# Patient Record
Sex: Male | Born: 1980 | Race: White | Hispanic: No | Marital: Married | Smoking: Current every day smoker
Health system: Southern US, Community
[De-identification: ages and names within clinical notes are randomized; demographics above are authoritative.]

---

## 2016-07-25 ENCOUNTER — Emergency Department
Admission: EM | Admit: 2016-07-25 | Discharge: 2016-07-25 | Disposition: A | Discharge status: Home / Self Care | Payer: Self-pay | Attending: Emergency Medicine | Admitting: Emergency Medicine

## 2016-07-25 ENCOUNTER — Encounter: Payer: Self-pay | Admitting: Emergency Medicine

## 2016-07-25 DIAGNOSIS — F1410 Cocaine abuse, uncomplicated: Secondary | ICD-10-CM | POA: Insufficient documentation

## 2016-07-25 DIAGNOSIS — F1310 Sedative, hypnotic or anxiolytic abuse, uncomplicated: Secondary | ICD-10-CM | POA: Insufficient documentation

## 2016-07-25 DIAGNOSIS — F17210 Nicotine dependence, cigarettes, uncomplicated: Secondary | ICD-10-CM | POA: Insufficient documentation

## 2016-07-25 DIAGNOSIS — F1520 Other stimulant dependence, uncomplicated: Secondary | ICD-10-CM | POA: Insufficient documentation

## 2016-07-25 DIAGNOSIS — F1910 Other psychoactive substance abuse, uncomplicated: Secondary | ICD-10-CM

## 2016-07-25 LAB — CBC
HEMATOCRIT: 42.1 % (ref 40.0–52.0)
HEMOGLOBIN: 14.3 g/dL (ref 13.0–18.0)
MCH: 29 pg (ref 26.0–34.0)
MCHC: 34 g/dL (ref 32.0–36.0)
MCV: 85.3 fL (ref 80.0–100.0)
Platelets: 247 10*3/uL (ref 150–440)
RBC: 4.93 MIL/uL (ref 4.40–5.90)
RDW: 13.9 % (ref 11.5–14.5)
WBC: 12.9 10*3/uL — ABNORMAL HIGH (ref 3.8–10.6)

## 2016-07-25 LAB — BASIC METABOLIC PANEL
Anion gap: 6 (ref 5–15)
BUN: 8 mg/dL (ref 6–20)
CHLORIDE: 105 mmol/L (ref 101–111)
CO2: 29 mmol/L (ref 22–32)
Calcium: 9.1 mg/dL (ref 8.9–10.3)
Creatinine, Ser: 0.8 mg/dL (ref 0.61–1.24)
GFR calc non Af Amer: >60 mL/min (ref >60)
Glucose, Bld: 96 mg/dL (ref 65–99)
POTASSIUM: 3.5 mmol/L (ref 3.5–5.1)
Sodium: 140 mmol/L (ref 135–145)

## 2016-07-25 LAB — URINE DRUG SCREEN, QUALITATIVE (ARMC ONLY)
Amphetamines, Ur Screen: NOT DETECTED
Barbiturates, Ur Screen: NOT DETECTED
Benzodiazepine, Ur Scrn: POSITIVE — AB
COCAINE METABOLITE, UR ~~LOC~~: POSITIVE — AB
Cannabinoid 50 Ng, Ur ~~LOC~~: NOT DETECTED
MDMA (ECSTASY) UR SCREEN: NOT DETECTED
METHADONE SCREEN, URINE: NOT DETECTED
OPIATE, UR SCREEN: NOT DETECTED
Phencyclidine (PCP) Ur S: NOT DETECTED
TRICYCLIC, UR SCREEN: NOT DETECTED

## 2016-07-25 LAB — ETHANOL: Alcohol, Ethyl (B): <5 mg/dL (ref <5)

## 2016-07-25 NOTE — ED Provider Notes (Signed)
[facility] Emergency Department Provider Note  ____________________________________________  Time seen: Approximately 8:02 PM  I have reviewed the triage vital signs and the nursing notes.   HISTORY  Chief Complaint Drug Problem    HPI Joshua Ball is a 36 y.o. male w/ a hx of drug abuse presentingfor detox. The patient reports that he abuses "everything." He admits to methamphetamines, cocaine. He has no medical complaints at this time.   History reviewed. No pertinent past medical history.  There are no active problems to display for this patient.   History reviewed. No pertinent surgical history.    Allergies Patient has no known allergies.  History reviewed. No pertinent family history.  Social History Social History  Substance Use Topics  . Smoking status: Current Every Day Smoker  . Smokeless tobacco: Never Used  . Alcohol use Yes    Review of Systems Constitutional: No fever/chills.No shakiness. No general malaise. Eyes: No visual changes. ENT: No sore throat. No congestion or rhinorrhea. Cardiovascular: Denies chest pain. Denies palpitations. Respiratory: Denies shortness of breath.  No cough. Gastrointestinal: No abdominal pain.  No nausea, no vomiting.  No diarrhea.  No constipation. Genitourinary: Negative for dysuria. Musculoskeletal: Negative for back pain. Skin: Negative for rash. Neurological: Negative for headaches. No focal numbness, tingling or weakness.  Psychiatric:Denies SI, HI or hallucinations. Positive polysubstance abuse.  ____________________________________________   PHYSICAL EXAM:  VITAL SIGNS: ED Triage Vitals [07/25/16 1926]  Enc Vitals Group     BP 119/85     Pulse Rate (!) 110     Resp 16     Temp 98 F (36.7 C)     Temp Source Oral     SpO2 99 %     Weight 150 lb (68 kg)     Height 5\' 7"  (1.702 m)     Head Circumference      Peak Flow      Pain Score      Pain Loc      Pain Edu?       Excl. in [place]?     Constitutional: Alert and oriented. Well appearing and in no acute distress. Answers questions appropriately. Eyes: Conjunctivae are normal.  EOMI. No scleral icterus. Head: Atraumatic. Nose: No congestion/rhinnorhea. Mouth/Throat: Mucous membranes are moist.  Neck: No stridor.  Supple.   Cardiovascular: Normal rate, regular rhythm. No murmurs, rubs or gallops.  Respiratory: Normal respiratory effort.  No accessory muscle use or retractions. Lungs CTAB.  No wheezes, rales or ronchi. Gastrointestinal: Soft, nontender and nondistended.  No guarding or rebound.  No peritoneal signs. Musculoskeletal: No LE edema.  Neurologic:  A&Ox3.  Speech is clear.  Face and smile are symmetric.  EOMI.  Moves all extremities well. Skin:  Skin is warm, dry and intact. No rash noted. Psychiatric: + Polysubstance abuse.  Denies SI, HI or hallucinations.  ____________________________________________   LABS (all labs ordered are listed, but only abnormal results are displayed)  Labs Reviewed  CBC  BASIC METABOLIC PANEL  URINE DRUG SCREEN, QUALITATIVE ([facility] ONLY)  ETHANOL   ____________________________________________  EKG  Not indicated ____________________________________________  RADIOLOGY  No results found.  ____________________________________________   PROCEDURES  Procedure(s) performed: None  Procedures  Critical Care performed: No ____________________________________________   INITIAL IMPRESSION / ASSESSMENT AND PLAN / ED COURSE  Pertinent labs & imaging results that were available during my care of the patient were reviewed by me and considered in my medical decision making (see chart for details).  [age] y.o.  M w/ hx of polysubstance abuse presenting for detox.  Pt denies any SI, HI, or hallucinations and has no medical complaints.  D/W TTS who will come evaluate pt for outpatient detox.  ____________________________________________  FINAL CLINICAL  IMPRESSION(S) / ED DIAGNOSES  Final diagnoses:  Polysubstance abuse         NEW MEDICATIONS STARTED DURING THIS VISIT:  New Prescriptions   No medications on file      Rockne Menghini, MD 07/25/16 2010

## 2016-07-25 NOTE — ED Notes (Signed)
Pt presents to ED, requesting detox from cocaine, meth, ativan and cymboxone. Pt sates he has been using for about 6 months. Pt alerts and oriented x3 at this time.

## 2016-07-25 NOTE — Discharge Instructions (Signed)
Please return to the emergency department for severe pain, or any other symptoms concerning to you.

## 2016-07-25 NOTE — ED Triage Notes (Signed)
Pt ambulatory to triage with steady gait, no distress noted. Pt reports he has been using Meth, Cocaine, ativan and cymboxone for approximately 6 months and wants to go through detox to come off of drugs. Pt sts he has used cocaine, ativan and cymboxone today, denies meth and alcohol.

## 2016-07-25 NOTE — ED Notes (Signed)
Pt refused to wait to go detox facility at midnight. Pt denies SI/HI at this time.

## 2018-12-31 ENCOUNTER — Encounter: Payer: Self-pay | Admitting: Emergency Medicine

## 2018-12-31 ENCOUNTER — Other Ambulatory Visit: Payer: Self-pay

## 2018-12-31 DIAGNOSIS — F17210 Nicotine dependence, cigarettes, uncomplicated: Secondary | ICD-10-CM | POA: Insufficient documentation

## 2018-12-31 DIAGNOSIS — J0191 Acute recurrent sinusitis, unspecified: Secondary | ICD-10-CM | POA: Insufficient documentation

## 2018-12-31 NOTE — ED Triage Notes (Signed)
Pt c/o left ear pain x 2 days, reports trying tea tree oil and peroxide no relief.  Pt reports having clogged sinus cavity several years ago on the same side and needed oral anitbiotics.

## 2019-01-01 ENCOUNTER — Emergency Department
Admission: EM | Admit: 2019-01-01 | Discharge: 2019-01-01 | Disposition: A | Discharge status: Home / Self Care | Payer: Self-pay | Attending: Emergency Medicine | Admitting: Emergency Medicine

## 2019-01-01 DIAGNOSIS — J0191 Acute recurrent sinusitis, unspecified: Secondary | ICD-10-CM

## 2019-01-01 MED ORDER — IBUPROFEN 600 MG PO TABS
600.0000 mg | ORAL_TABLET | Freq: Once | ORAL | Status: AC
  Administered 2019-01-01: 02:00:00 600 mg via ORAL
  Filled 2019-01-01: qty 1

## 2019-01-01 MED ORDER — OXYMETAZOLINE HCL 0.05 % NA SOLN
1.0000 | Freq: Once | NASAL | Status: AC
  Administered 2019-01-01: 02:00:00 1 via NASAL
  Filled 2019-01-01: qty 30

## 2019-01-01 MED ORDER — FLUTICASONE FUROATE 27.5 MCG/SPRAY NA SUSP: 1.0000 | Freq: Every day | NASAL | 0 refills | Status: AC

## 2019-01-01 NOTE — ED Provider Notes (Signed)
[facility] Emergency Department Provider Note  ____________________________________________  Time seen: Approximately 1:46 AM  I have reviewed the triage vital signs and the nursing notes.   HISTORY  Chief Complaint Otalgia   HPI Bart Ashford is a 38 y.o. male no significant past medical history who presents for evaluation of left ear pain.  Patient reports 2 days of pressure-like left ear pain associated with left frontal sinus pressure, pain on the left upper jaw area, and congestion.  No diplopia, no fever, no sore throat, no changes in hearing, no disequilibrium.  Has tried tea tree oil and peroxide inside his ear with no relief.   PMH None -reviewed  Prior to Admission medications   Medication Sig Start Date End Date Taking? Authorizing Provider  fluticasone (VERAMYST) 27.5 MCG/SPRAY nasal spray Place 1 spray into the nose daily. 01/01/19 01/01/20  Rudene Re, MD    Allergies Patient has no known allergies.  No family history on file.  Social History Social History   Tobacco Use  . Smoking status: Current Every Day Smoker    Packs/day: 1.00    Types: Cigarettes  . Smokeless tobacco: Never Used  Substance Use Topics  . Alcohol use: Yes  . Drug use: Yes    Types: Cocaine, Methamphetamines    Review of Systems  Constitutional: Negative for fever. Eyes: Negative for visual changes. ENT: Negative for sore throat. + L ear pain, L frontal sinus pressure Neck: No neck pain  Cardiovascular: Negative for chest pain. Respiratory: Negative for shortness of breath. Gastrointestinal: Negative for abdominal pain, vomiting or diarrhea. Genitourinary: Negative for dysuria. Musculoskeletal: Negative for back pain. Skin: Negative for rash. Neurological: Negative for headaches, weakness or numbness. Psych: No SI or HI  ____________________________________________   PHYSICAL EXAM:  VITAL SIGNS: ED Triage Vitals  Enc Vitals  Group     BP 12/31/18 2322 123/85     Pulse Rate 12/31/18 2322 92     Resp 12/31/18 2322 18     Temp 12/31/18 2322 99.6 F (37.6 C)     Temp Source 12/31/18 2322 Oral     SpO2 12/31/18 2322 98 %     Weight --      Height --      Head Circumference --      Peak Flow --      Pain Score 12/31/18 2324 8     Pain Loc --      Pain Edu? --      Excl. in [place]? --     Constitutional: Alert and oriented. Well appearing and in no apparent distress. HEENT:      Head: Normocephalic and atraumatic.   Palpation of the left frontal sinus induces mild discomfort      Eyes: Conjunctivae are normal. Sclera is non-icteric.  Extraocular movements are intact, pupils are equal and round and reactive, no swelling or signs of periorbital cellulitis.      Ears: Normal outer canal with no evidence of infection, normal TM with no exudate, landmarks are clear, no tenderness of the mastoid process, no erythema or tenderness of the outer ear      Mouth/Throat: Mucous membranes are moist.  Oropharynx is clear with no exudates, no peritonsillar abscess, no trismus, floor of the mouth is soft      Neck: Supple with no signs of meningismus. Cardiovascular: Regular rate and rhythm.  Respiratory: Normal respiratory effort.  Musculoskeletal: No edema, cyanosis, or erythema of extremities. Neurologic: Normal speech and language.  Face is symmetric. Moving all extremities. No gross focal neurologic deficits are appreciated. Skin: Skin is warm, dry and intact. No rash noted. Psychiatric: Mood and affect are normal. Speech and behavior are normal.  ____________________________________________   LABS (all labs ordered are listed, but only abnormal results are displayed)  Labs Reviewed - No data to display ____________________________________________  EKG  none  ____________________________________________  RADIOLOGY  none  ____________________________________________   PROCEDURES  Procedure(s) performed:  None Procedures Critical Care performed:  None ____________________________________________   INITIAL IMPRESSION / ASSESSMENT AND PLAN / ED COURSE   38 y.o. male no significant past medical history who presents for evaluation of left ear pain.  Presentation concerning with sinus pain and pressure.  Recommended sinus washout, patient was provided with Afrin and nasal fluticasone, ibuprofen for pain, follow-up with ENT as needed.  Discussed my standard return precautions.       As part of my medical decision making, I reviewed the following data within the electronic MEDICAL RECORD NUMBER Nursing notes reviewed and incorporated, Old chart reviewed, Notes from prior ED visits and [place] Controlled Substance Database   Please note:  Patient was evaluated in Emergency Department today for the symptoms described in the history of present illness. Patient was evaluated in the context of the global COVID-19 pandemic, which necessitated consideration that the patient might be at risk for infection with the SARS-CoV-2 virus that causes COVID-19. Institutional protocols and algorithms that pertain to the evaluation of patients at risk for COVID-19 are in a state of rapid change based on information released by regulatory bodies including the CDC and federal and state organizations. These policies and algorithms were followed during the patient's care in the ED.  Some ED evaluations and interventions may be delayed as a result of limited staffing during the pandemic.   ____________________________________________   FINAL CLINICAL IMPRESSION(S) / ED DIAGNOSES   Final diagnoses:  Acute recurrent sinusitis, unspecified location      NEW MEDICATIONS STARTED DURING THIS VISIT:  ED Discharge Orders         Ordered    fluticasone (VERAMYST) 27.5 MCG/SPRAY nasal spray  Daily     01/01/19 0136           Note:  This document was prepared using Dragon voice recognition software and may include  unintentional dictation errors.    Don Perking, [place], MD 01/01/19 [phone]

## 2019-01-01 NOTE — ED Notes (Signed)
Pt sitting up in bed; in NAD. C/o HA, pain in all of his teeth, and L ear pain/pressure since yesterday.  Denies sore throat, fevers, or any other symptoms. Reports normal hearing out of L ear. States no reliefs so far with OTC meds at home.

## 2019-10-03 ENCOUNTER — Emergency Department: Payer: Self-pay

## 2019-10-03 ENCOUNTER — Other Ambulatory Visit: Payer: Self-pay

## 2019-10-03 ENCOUNTER — Emergency Department
Admission: EM | Admit: 2019-10-03 | Discharge: 2019-10-03 | Disposition: A | Discharge status: Home / Self Care | Payer: Self-pay | Attending: Emergency Medicine | Admitting: Emergency Medicine

## 2019-10-03 ENCOUNTER — Encounter: Payer: Self-pay | Admitting: Emergency Medicine

## 2019-10-03 DIAGNOSIS — Z20822 Contact with and (suspected) exposure to covid-19: Secondary | ICD-10-CM | POA: Insufficient documentation

## 2019-10-03 DIAGNOSIS — R531 Weakness: Secondary | ICD-10-CM

## 2019-10-03 DIAGNOSIS — R0602 Shortness of breath: Secondary | ICD-10-CM | POA: Insufficient documentation

## 2019-10-03 DIAGNOSIS — U071 COVID-19: Secondary | ICD-10-CM

## 2019-10-03 DIAGNOSIS — R05 Cough: Secondary | ICD-10-CM | POA: Insufficient documentation

## 2019-10-03 DIAGNOSIS — R079 Chest pain, unspecified: Secondary | ICD-10-CM | POA: Insufficient documentation

## 2019-10-03 DIAGNOSIS — R5383 Other fatigue: Secondary | ICD-10-CM | POA: Insufficient documentation

## 2019-10-03 DIAGNOSIS — R0789 Other chest pain: Secondary | ICD-10-CM

## 2019-10-03 DIAGNOSIS — R059 Cough, unspecified: Secondary | ICD-10-CM

## 2019-10-03 DIAGNOSIS — F17210 Nicotine dependence, cigarettes, uncomplicated: Secondary | ICD-10-CM | POA: Insufficient documentation

## 2019-10-03 DIAGNOSIS — M6281 Muscle weakness (generalized): Secondary | ICD-10-CM | POA: Insufficient documentation

## 2019-10-03 LAB — SARS CORONAVIRUS 2 BY RT PCR (HOSPITAL ORDER, PERFORMED IN ~~LOC~~ HOSPITAL LAB): SARS Coronavirus 2: NEGATIVE

## 2019-10-03 LAB — CBC
HCT: 43.3 % (ref 39.0–52.0)
Hemoglobin: 14.6 g/dL (ref 13.0–17.0)
MCH: 29.5 pg (ref 26.0–34.0)
MCHC: 33.7 g/dL (ref 30.0–36.0)
MCV: 87.5 fL (ref 80.0–100.0)
Platelets: 255 10*3/uL (ref 150–400)
RBC: 4.95 MIL/uL (ref 4.22–5.81)
RDW: 13.2 % (ref 11.5–15.5)
WBC: 10.8 10*3/uL — ABNORMAL HIGH (ref 4.0–10.5)
nRBC: 0 % (ref 0.0–0.2)

## 2019-10-03 LAB — BASIC METABOLIC PANEL
Anion gap: 10 (ref 5–15)
BUN: 11 mg/dL (ref 6–20)
CO2: 24 mmol/L (ref 22–32)
Calcium: 9.3 mg/dL (ref 8.9–10.3)
Chloride: 104 mmol/L (ref 98–111)
Creatinine, Ser: 0.82 mg/dL (ref 0.61–1.24)
GFR calc Af Amer: >60 mL/min (ref >60)
GFR calc non Af Amer: >60 mL/min (ref >60)
Glucose, Bld: 91 mg/dL (ref 70–99)
Potassium: 3.9 mmol/L (ref 3.5–5.1)
Sodium: 138 mmol/L (ref 135–145)

## 2019-10-03 LAB — TROPONIN I (HIGH SENSITIVITY): Troponin I (High Sensitivity): 4 ng/L (ref <18)

## 2019-10-03 NOTE — ED Triage Notes (Signed)
Pt reports for the past week has had a productive cough, chest heaviness and sob. Pt states has gotten worse over the last couple of days.

## 2019-10-03 NOTE — ED Provider Notes (Signed)
[facility] Emergency Department Provider Note ____________________________________________   First MD Initiated Contact with Patient 10/03/19 1833     (approximate)  I have reviewed the triage vital signs and the nursing notes.  HISTORY  Chief Complaint Cough, Chest Pain, and Shortness of Breath   HPI [name] is a 39 y.o. malewho presents to the ED for evaluation of shortness of breath, cough and lethargy.  Chart review indicates no relevant medical history. Patient reports taking no regular prescription medications Patient is not vaccinated for COVID-19.  No one is sick at home.  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   Patient reports about 1 week of cough that is occasionally productive of sputum, constant chest heaviness, shortness of breath and lethargy.  He reports having a consistent heaviness sensation over the past 1 week over his right chest that is nonradiating, and has no exertional worsening.  Reports shortness of breath, cough that is typically nonproductive, but he reports is occasionally productive of yellow sputum.  Reports generalized weakness, lethargy and altered taste/smell.  Further reports poor p.o. intake of solids due to lack of appetite.  Denies postprandial abdominal pain.  He denies any syncopal episodes, but reports having to take multiple days off of work due to feeling so weak and tired that he was unable to function at work.  Denies documented fevers, though reports subjective chills and was noted to be 100 F in triage.  History reviewed. No pertinent past medical history.  There are no problems to display for this patient.   History reviewed. No pertinent surgical history.  Prior to Admission medications   Medication Sig Start Date End Date Taking? Authorizing Provider  fluticasone (VERAMYST) 27.5 MCG/SPRAY nasal spray Place 1 spray into the nose daily. 01/01/19 01/01/20  Nita Sickle, MD    Allergies Patient has no known allergies.  No family history on file.  Social History Social History   Tobacco Use  . Smoking status: Current Every Day Smoker    Packs/day: 1.00    Types: Cigarettes  . Smokeless tobacco: Never Used  Vaping Use  . Vaping Use: Never used  Substance Use Topics  . Alcohol use: Yes  . Drug use: Yes    Types: Cocaine, Methamphetamines    Review of Systems  Constitutional: Positive for subjective fever/chills Eyes: No visual changes. ENT: No sore throat. Cardiovascular: Positive for chest pain Respiratory: Positive for cough and shortness of breath Gastrointestinal: No abdominal pain.  No nausea, no vomiting.  No diarrhea.  No constipation. Genitourinary: Negative for dysuria. Musculoskeletal: Negative for back pain. Skin: Negative for rash. Neurological: Negative for headaches, focal weakness or numbness. ____________________________________________   PHYSICAL EXAM:  VITAL SIGNS: Vitals:   10/03/19 1808 10/03/19 1825  BP: 119/84 137/84  Pulse: 81 80  Resp: 18 16  Temp: 98.3 F (36.8 C) 98.1 F (36.7 C)  SpO2: 98% 98%     Constitutional: Alert and oriented. Well appearing and in no acute distress.  Conversational in full sentences without distress. Eyes: Conjunctivae are normal. PERRL. EOMI. Head: Atraumatic. Nose: No congestion/rhinnorhea. Mouth/Throat: Mucous membranes are moist.  Oropharynx non-erythematous.  Uvula is midline Neck: No stridor. No cervical spine tenderness to palpation. Cardiovascular: Normal rate, regular rhythm. Grossly normal heart sounds.  Good peripheral circulation. Respiratory: Normal respiratory effort.  No retractions. Lungs CTAB. Gastrointestinal: Soft , nondistended, nontender to palpation. No abdominal bruits. No CVA tenderness. Musculoskeletal: No lower extremity tenderness nor edema.  No joint effusions. No signs of acute trauma. Neurologic:  Normal speech and language. No gross  focal neurologic deficits are appreciated. No gait instability noted. Skin:  Skin is warm, dry and intact. No rash noted. Psychiatric: Mood and affect are normal. Speech and behavior are normal.  ____________________________________________   LABS (all labs ordered are listed, but only abnormal results are displayed)  Labs Reviewed  CBC - Abnormal; Notable for the following components:      Result Value   WBC 10.8 (*)    All other components within normal limits  SARS CORONAVIRUS 2 BY RT PCR (HOSPITAL ORDER, PERFORMED IN [facility])  BASIC METABOLIC PANEL  TROPONIN I (HIGH SENSITIVITY)   ____________________________________________  12 Lead EKG  Sinus rhythm, rate of 90 bpm.  Normal axis and intervals.  No evidence of acute ischemia. ____________________________________________  RADIOLOGY  ED MD interpretation: 2 view CXR without evidence of acute cardiopulmonary pathology.  Official radiology report(s): DG Chest 2 View  Result Date: 10/03/2019 CLINICAL DATA:  Chest pain EXAM: CHEST - 2 VIEW COMPARISON:  None. FINDINGS: The heart size and mediastinal contours are within normal limits. Both lungs are clear. The visualized skeletal structures are unremarkable. IMPRESSION: No active cardiopulmonary disease. Electronically Signed   By: Jonna Clark M.D.   On: 10/03/2019 17:35    ____________________________________________   PROCEDURES and INTERVENTIONS  Procedure(s) performed (including Critical Care):  Procedures  Medications - No data to display  ____________________________________________   MDM / ED COURSE  39 year old unvaccinated male presents with viral syndrome most consistent with COVID-19 despite him testing negative, amenable to outpatient management.  Nearly febrile in triage with temperature of 100 F, otherwise normal vital signs on room air.  Exam is reassuring without evidence of acute pathology derangements.  He has no dyspnea, tachypnea,  distress, signs of trauma or any other acute pathology.  Blood work demonstrates no acute derangements.  CXR is clear.  EKG is nonischemic and troponin is negative.  His generalized symptoms are most consistent with a viral syndrome, and most consistent with COVID-19.  PCR swab obtained during his ED visit returns negative right before he leaves.  I advised him of this negative test result, but recommend he quarantine at home and mask around others as if he tested positive.  I have a high suspicion for COVID-19 despite his negative test.  We discussed outpatient management of his viral illness. ,  We discussed return precautions for the ED.  Patient medically stable for discharge home.      ____________________________________________   FINAL CLINICAL IMPRESSION(S) / ED DIAGNOSES  Final diagnoses:  Clinical diagnosis of COVID-19  Generalized weakness  Cough  Other chest pain     ED Discharge Orders    None       Joshua Ball   Note:  This document was prepared using Dragon voice recognition software and may include unintentional dictation errors.   Delton [place], MD 10/03/19 2051

## 2019-10-03 NOTE — Discharge Instructions (Signed)
Please take Tylenol and ibuprofen/Advil for your pain.  It is safe to take them together, or to alternate them every few hours.  Take up to 1000mg  of Tylenol at a time, up to 4 times per day.  Do not take more than 4000 mg of Tylenol in 24 hours.  For ibuprofen, take 400-600 mg, 4-5 times per day.  As we discussed, I strongly suspect that you have COVID-19. Your swab is in process and I will give you a call after results.  Regardless of this test result, based off of all of your symptoms, I would still stay home quarantine and mask until you feel better and treat this like a COVID-19.  Use the above Tylenol/ibuprofen regimen to help control your fevers and muscle aches, helping you feel better to stay hydrated and get some rest.  It is okay if you do not eat solid food for 2 days.  If you develop any worsening symptoms despite these medications, please return to the ED.

## 2021-12-17 IMAGING — CR DG CHEST 2V
2 series · 2 of 2 positions shown · non-contrast
Comparison: None.

CLINICAL DATA: Chest pain

EXAM:
CHEST - 2 VIEW

[chest pa]
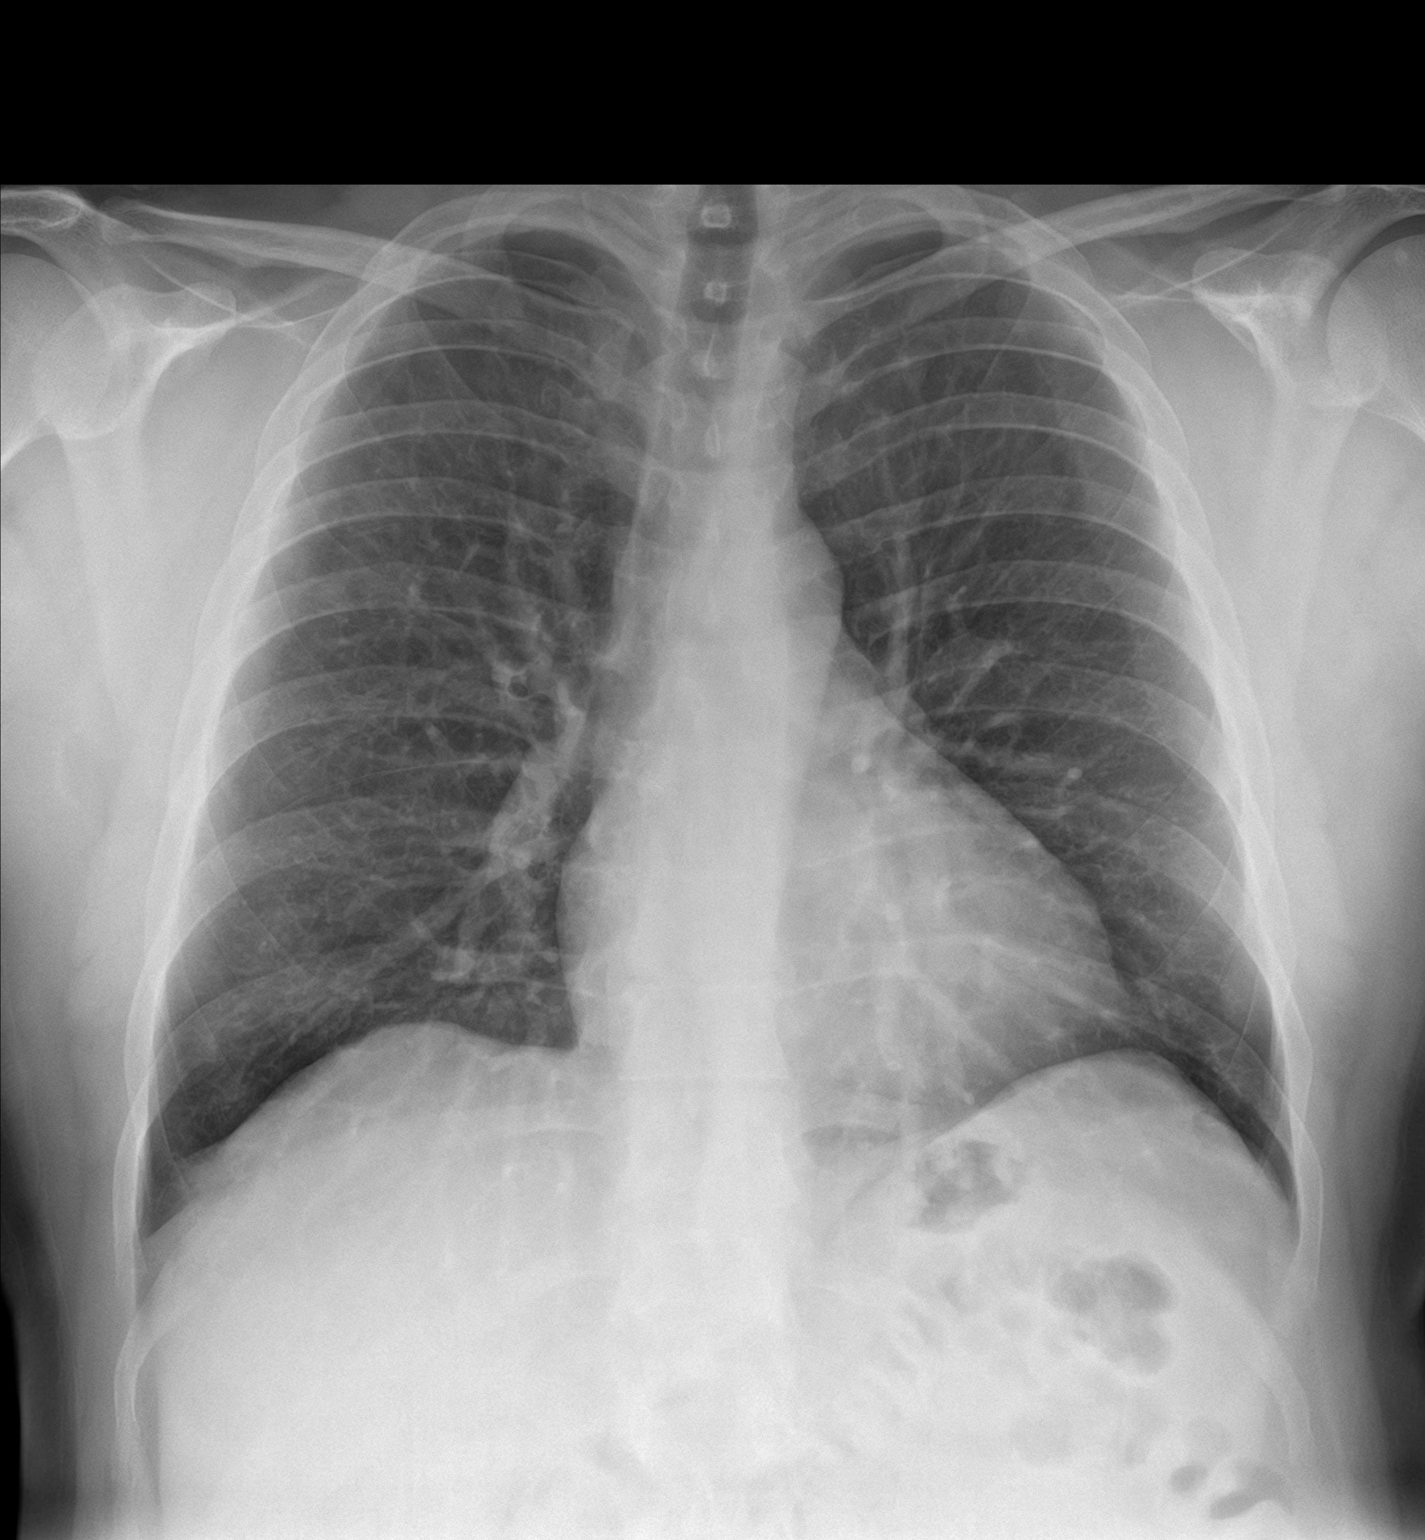

[chest lat]
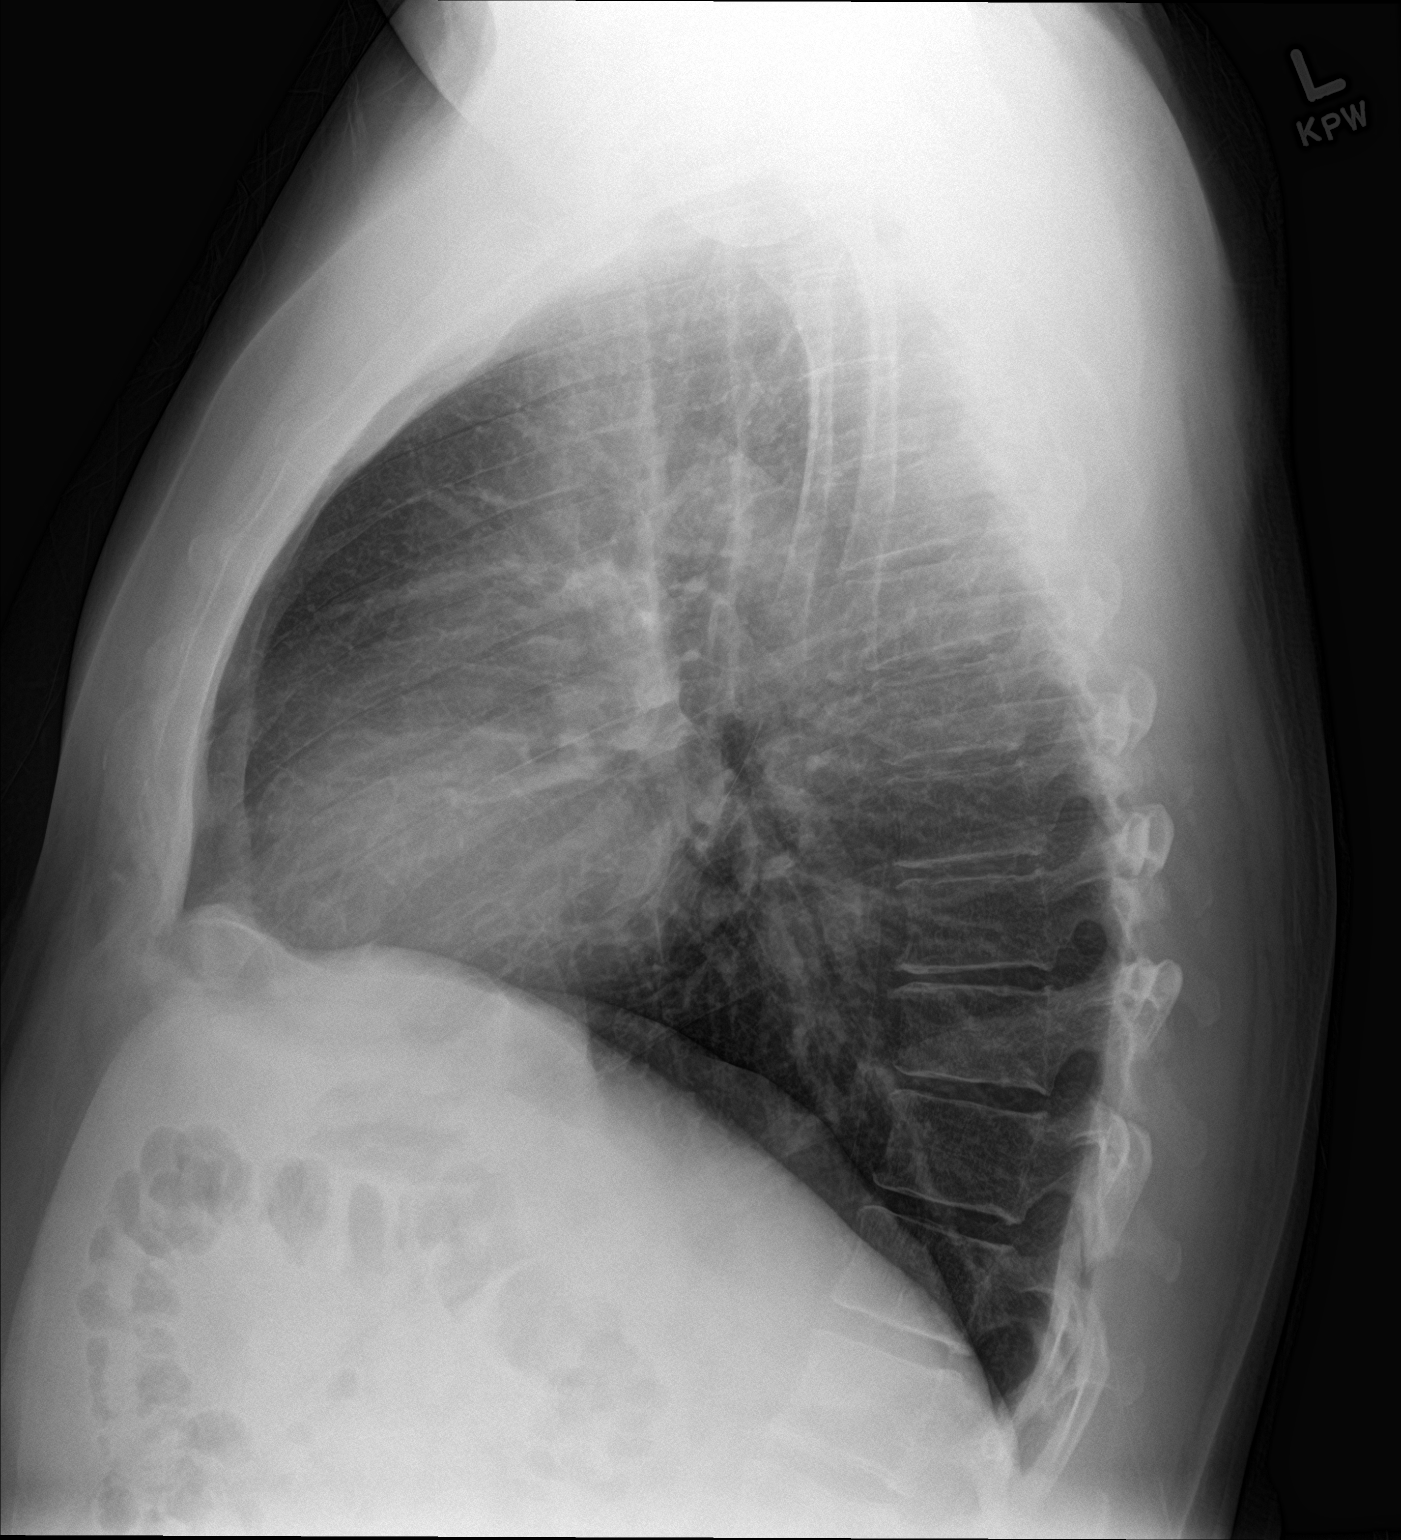

[2 of 2 positions shown; findings below may reference images not displayed]

FINDINGS: The heart size and mediastinal contours are within normal limits.
Both lungs are clear. The visualized skeletal structures are
unremarkable.
IMPRESSION: No active cardiopulmonary disease.
